# Patient Record
Sex: Female | Born: 1950 | Race: White | Hispanic: No | Marital: Married | Smoking: Never smoker
Health system: Southern US, Community
[De-identification: ages and names within clinical notes are randomized; demographics above are authoritative.]

## PROBLEM LIST (undated history)

## (undated) DIAGNOSIS — Z8739 Personal history of other diseases of the musculoskeletal system and connective tissue: Secondary | ICD-10-CM

## (undated) DIAGNOSIS — R002 Palpitations: Secondary | ICD-10-CM

## (undated) DIAGNOSIS — Z780 Asymptomatic menopausal state: Secondary | ICD-10-CM

## (undated) HISTORY — PX: APPENDECTOMY: SHX54

## (undated) HISTORY — DX: Asymptomatic menopausal state: Z78.0

## (undated) HISTORY — DX: Palpitations: R00.2

## (undated) HISTORY — DX: Personal history of other diseases of the musculoskeletal system and connective tissue: Z87.39

---

## 1979-08-05 HISTORY — PX: TUBAL LIGATION: SHX77

## 2011-11-19 ENCOUNTER — Emergency Department: Payer: Self-pay | Admitting: Emergency Medicine

## 2013-01-13 LAB — HM PAP SMEAR: HM PAP: NEGATIVE

## 2013-01-20 ENCOUNTER — Ambulatory Visit: Payer: Self-pay | Admitting: Obstetrics and Gynecology

## 2014-03-10 ENCOUNTER — Ambulatory Visit: Payer: Self-pay | Admitting: Obstetrics and Gynecology

## 2014-03-13 LAB — HM MAMMOGRAPHY

## 2014-11-24 DIAGNOSIS — N6019 Diffuse cystic mastopathy of unspecified breast: Secondary | ICD-10-CM | POA: Insufficient documentation

## 2014-11-24 DIAGNOSIS — N952 Postmenopausal atrophic vaginitis: Secondary | ICD-10-CM | POA: Insufficient documentation

## 2014-11-24 LAB — HM COLONOSCOPY

## 2015-01-23 ENCOUNTER — Encounter: Payer: Self-pay | Admitting: Obstetrics and Gynecology

## 2015-02-01 ENCOUNTER — Ambulatory Visit (INDEPENDENT_AMBULATORY_CARE_PROVIDER_SITE_OTHER): Payer: Federal, State, Local not specified - PPO | Admitting: Obstetrics and Gynecology

## 2015-02-01 ENCOUNTER — Encounter: Payer: Self-pay | Admitting: Obstetrics and Gynecology

## 2015-02-01 VITALS — BP 116/66 | HR 71 | Ht 68.0 in | Wt 155.9 lb

## 2015-02-01 DIAGNOSIS — Z01419 Encounter for gynecological examination (general) (routine) without abnormal findings: Secondary | ICD-10-CM | POA: Diagnosis not present

## 2015-02-01 DIAGNOSIS — Z1211 Encounter for screening for malignant neoplasm of colon: Secondary | ICD-10-CM

## 2015-02-01 DIAGNOSIS — Z780 Asymptomatic menopausal state: Secondary | ICD-10-CM | POA: Insufficient documentation

## 2015-02-01 DIAGNOSIS — Z1231 Encounter for screening mammogram for malignant neoplasm of breast: Secondary | ICD-10-CM

## 2015-02-01 NOTE — Progress Notes (Signed)
Patient ID: Paige Romero Zieske, female   DOB: 05/31/1951, 64 y.o.   MRN: [id number]  ANNUAL PREVENTATIVE CARE GYN  ENCOUNTER NOTE  Subjective:       Paige Romero Higinbotham is a 64 y.o., [phone], female with history of vaginal atrophy, fibrocystic breast and heart palpitations is presenting for a routine annual gynecologic exam.  Current complaints: 1.  none   BLF, PA-S Gynecologic History No LMP recorded. Patient is postmenopausal. Contraception: post menopausal status;  Last Pap: 2014 neg.neg . Results were: normal Last mammogram: 03/13/2014. Results were: normal  Obstetric History OB History  No data available    Past Medical History  Diagnosis Date  . Menopause   . History of osteopenia   . Heart palpitations     Past Surgical History  Procedure Laterality Date  . Tubal ligation  1981  . Appendectomy      No current outpatient prescriptions on file prior to visit.   No current facility-administered medications on file prior to visit.    Allergies  Allergen Reactions  . Penicillins Nausea Only    History   Social History  . Marital Status: Married    Spouse Name: N/A  . Number of Children: N/A  . Years of Education: N/A   Occupational History  . Not on file.   Social History Main Topics  . Smoking status: Never Smoker   . Smokeless tobacco: Not on file  . Alcohol Use: No  . Drug Use: No  . Sexual Activity: No   Other Topics Concern  . Not on file   Social History Narrative    Family History  Problem Relation Age of Onset  . Heart disease Mother   . Colon cancer Brother   . Diabetes Neg Hx   . Breast cancer Neg Hx   . Ovarian cancer Neg Hx     The following portions of the patient's history were reviewed and updated as appropriate: allergies, current medications, past family history, past medical history, past social history, past surgical history and problem list.  Review of Systems ROS Review of Systems - General ROS: negative for - chills,  fatigue, fever, hot flashes, night sweats, weight gain or weight loss Psychological ROS: negative for - anxiety, decreased libido, depression, mood swings, physical abuse or sexual abuse Ophthalmic ROS: negative for - blurry vision, eye pain or loss of vision ENT ROS: negative for - headaches, hearing change, visual changes or vocal changes Allergy and Immunology ROS: negative for - hives, itchy/watery eyes or seasonal allergies Hematological and Lymphatic ROS: negative for - bleeding problems, bruising, swollen lymph nodes or weight loss Endocrine ROS: negative for - galactorrhea, hair pattern changes, hot flashes, malaise/lethargy, mood swings, palpitations, polydipsia/polyuria, skin changes, temperature intolerance or unexpected weight changes Breast ROS: negative for - new or changing breast lumps or nipple discharge Respiratory ROS: negative for - cough or shortness of breath Cardiovascular ROS: negative for - chest pain, irregular heartbeat, palpitations or shortness of breath Gastrointestinal ROS: no abdominal pain, change in bowel habits, or black or bloody stools Genito-Urinary ROS: no dysuria, trouble voiding, or hematuria Musculoskeletal ROS: negative for - joint pain or joint stiffness Neurological ROS: negative for - bowel and bladder control changes Dermatological ROS: negative for rash and skin lesion changes   Objective:   BP 116/66 mmHg  Pulse 71  Ht 5\' 8"  (1.727 m)  Wt 155 lb 14.4 oz (70.716 kg)  BMI 23.71 kg/m2 CONSTITUTIONAL: Well-developed, well-nourished female in no acute distress.  PSYCHIATRIC: Normal mood and affect. Normal behavior. Normal judgment and thought content. NEUROLGIC: Alert and oriented to person, place, and time. Normal muscle tone coordination. No cranial nerve deficit noted. HENT:  Normocephalic, atraumatic, External right and left ear normal. Oropharynx is clear and moist EYES: Conjunctivae and EOM are normal. Pupils are equal, round, and reactive  to light. No scleral icterus.  NECK: Normal range of motion, supple, no masses.  Normal thyroid.  SKIN: Skin is warm and dry. No rash noted. Not diaphoretic. No erythema. No pallor. CARDIOVASCULAR: Normal heart rate noted, regular rhythm, no murmur. RESPIRATORY: Clear to auscultation bilaterally. Effort and breath sounds normal, no problems with respiration noted. BREASTS: Symmetric in size. No masses, skin changes, nipple drainage, or lymphadenopathy. ABDOMEN: Soft, normal bowel sounds, no distention noted.  No tenderness, rebound or guarding.  BLADDER: Normal PELVIC:  External Genitalia: Atrophic changes present. No rash, no epithelial breakdown, no          lesions  BUS: Normal  Vagina: Atrophic introitus; single-digit exam done , vaginal wall atrophy present  Cervix: Atrophic changes  Uterus: Normal  Adnexa: Normal  RV: External Exam NormaI, No Rectal Masses and Normal Sphincter tone  MUSCULOSKELETAL: Normal range of motion. No tenderness.  No cyanosis, clubbing, or edema.  2+ distal pulses. LYMPHATIC: No Axillary, Supraclavicular, or Inguinal Adenopathy.    Assessment:   Annual gynecologic examination 64 y.o. Contraception: post menopausal status; asymptomatic Normal BMI  Vaginal atrophy - asymptomatic  Problem List Items Addressed This Visit    None      Plan:  Pap: not due until 2017  Mammogram: Ordered Stool Guaiac Testing:  Ordered Labs: Lipid 1, FBS, TSH and Hemoglobin A1C Routine preventative health maintenance measures emphasized: Exercise/Diet/Weight control, Tobacco Warnings, Alcohol/Substance use risks and Stress Management Return to Clinic - 1 Year   Crystal Zurich, CMA   Herold Harms, MD    I have seen, interviewed, and examined the patient in conjunction with the [employer].A. student and affirm the diagnosis and management plan. Shyam Dawson A. Mikesha Migliaccio, MD, [facility]

## 2015-08-10 IMAGING — MG MAM DGTL SCRN MAM NO ORDER W/CAD
5 series · 5 of 5 positions shown · non-contrast
Comparison: Previous exam(s).

CLINICAL DATA: Screening.

EXAM:
DIGITAL SCREENING BILATERAL MAMMOGRAM WITH CAD

[L CC]
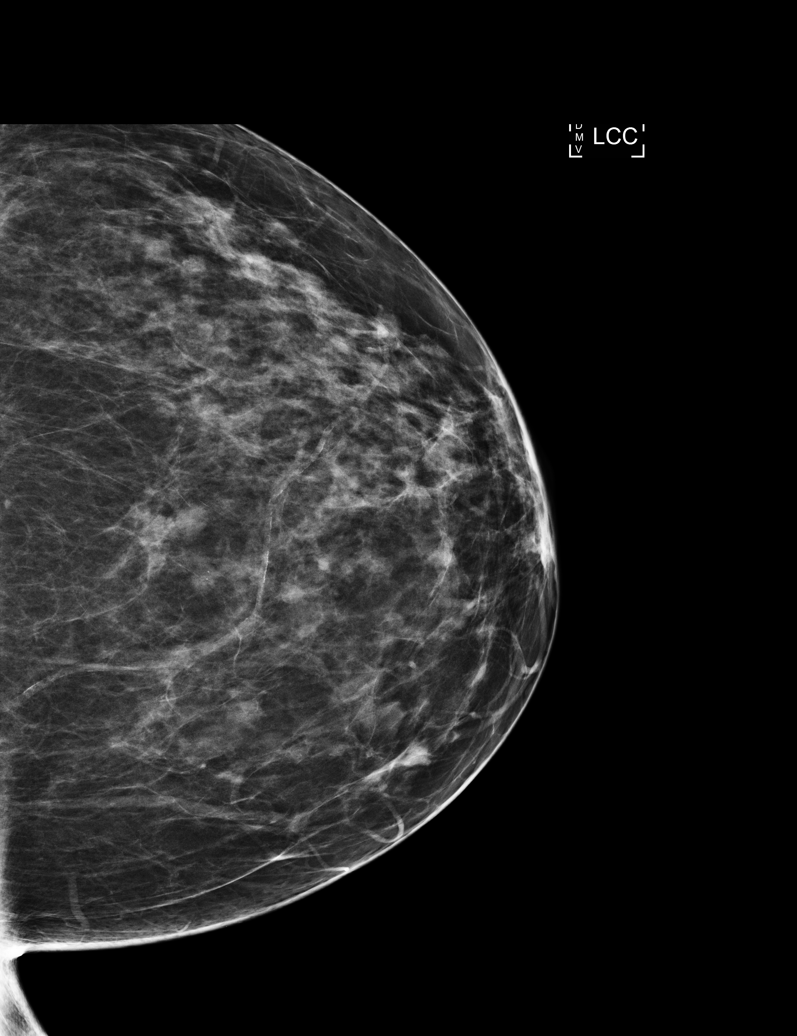

[L MLO (1 of 2)]
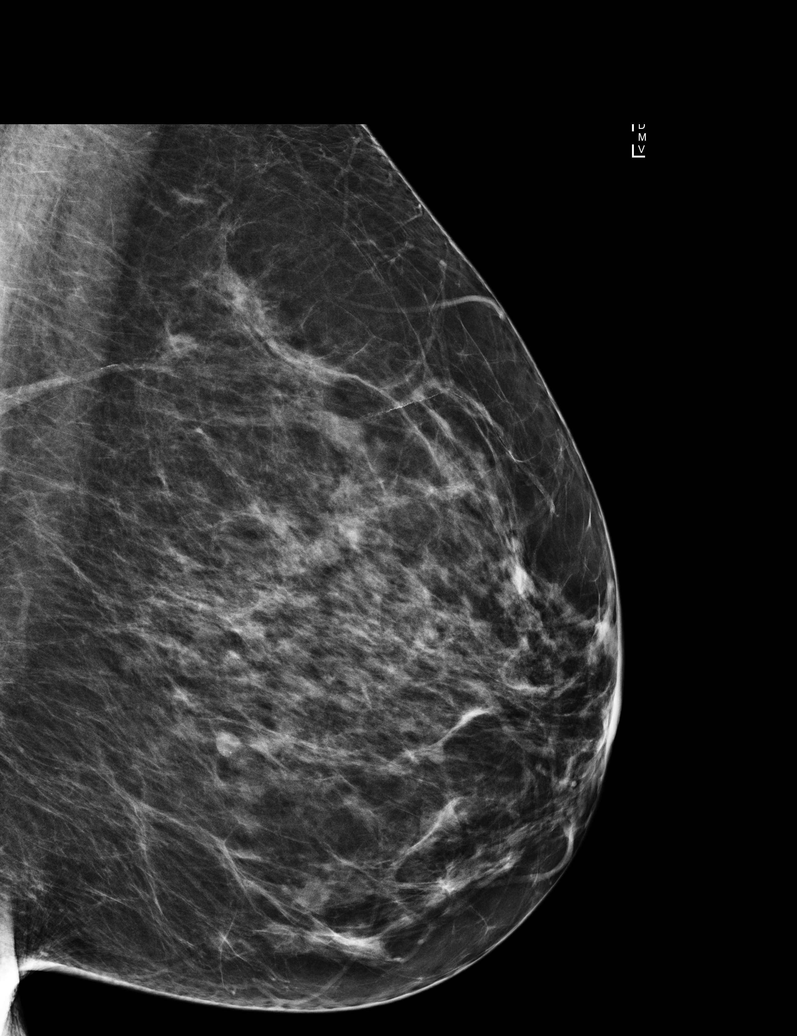

[R CC]
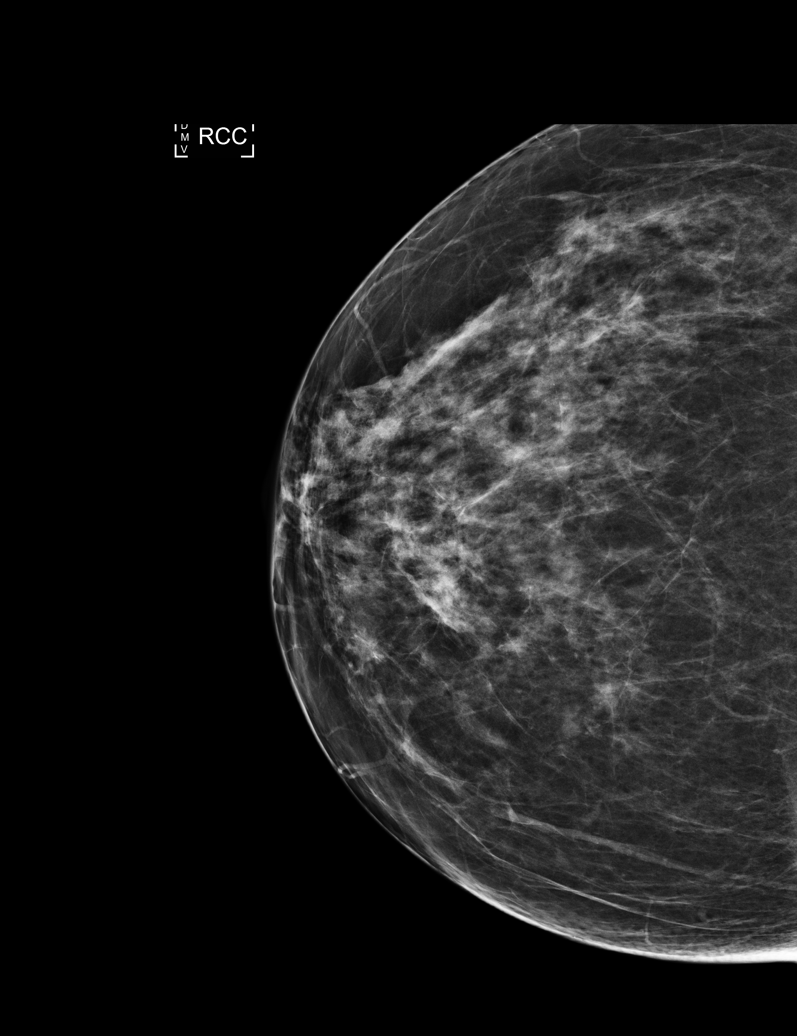

[R MLO]
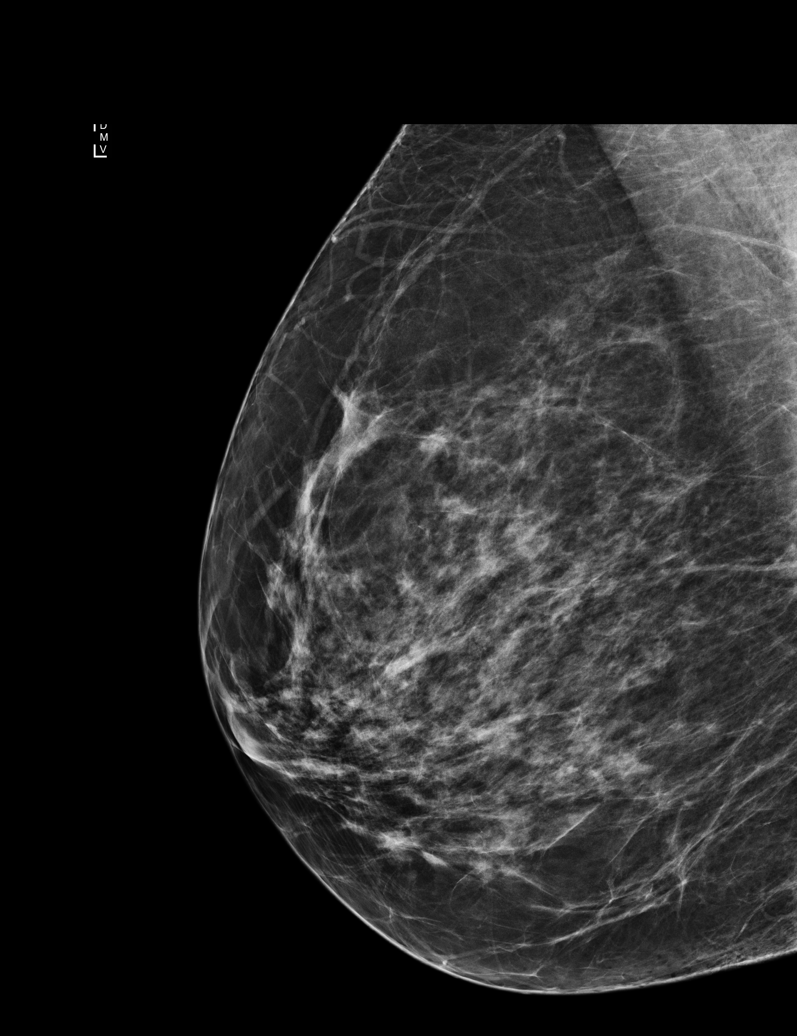

[L MLO (2 of 2)]
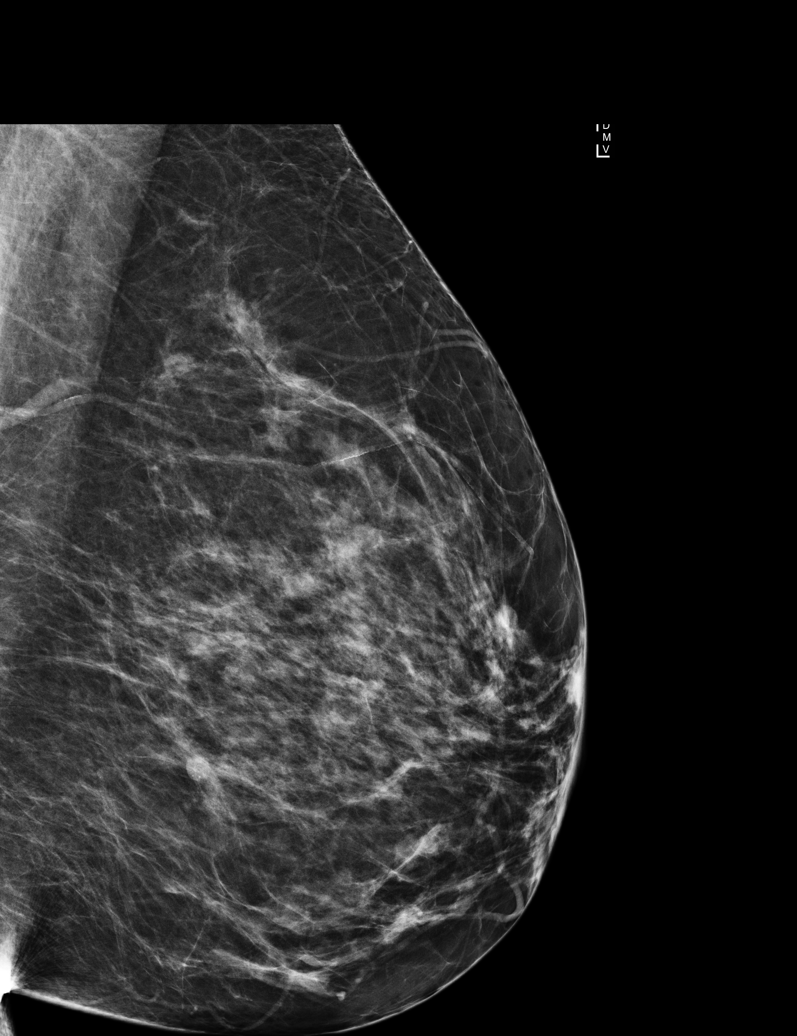

[5 of 5 positions shown; findings below may reference images not displayed]

ACR Breast Density Category c: The breast tissue is heterogeneously
dense, which may obscure small masses.
FINDINGS: There are no findings suspicious for malignancy. Images were
processed with CAD.
IMPRESSION: No mammographic evidence of malignancy. A result letter of this
screening mammogram will be mailed directly to the patient.

RECOMMENDATION:
Screening mammogram in one year. (Code:YJ-2-FEZ)

BI-RADS CATEGORY  1: Negative.

## 2016-02-12 ENCOUNTER — Encounter: Payer: Federal, State, Local not specified - PPO | Admitting: Obstetrics and Gynecology

## 2016-07-09 ENCOUNTER — Encounter: Payer: Federal, State, Local not specified - PPO | Admitting: Obstetrics and Gynecology

## 2016-07-11 ENCOUNTER — Other Ambulatory Visit: Payer: Self-pay | Admitting: Family Medicine

## 2016-07-11 DIAGNOSIS — Z780 Asymptomatic menopausal state: Secondary | ICD-10-CM

## 2016-07-11 DIAGNOSIS — Z1239 Encounter for other screening for malignant neoplasm of breast: Secondary | ICD-10-CM

## 2017-03-05 ENCOUNTER — Other Ambulatory Visit: Payer: Self-pay | Admitting: Family Medicine

## 2017-03-05 DIAGNOSIS — Z780 Asymptomatic menopausal state: Secondary | ICD-10-CM

## 2017-03-05 DIAGNOSIS — Z1239 Encounter for other screening for malignant neoplasm of breast: Secondary | ICD-10-CM

## 2017-03-16 ENCOUNTER — Ambulatory Visit
Admission: RE | Admit: 2017-03-16 | Discharge: 2017-03-16 | Disposition: A | Discharge status: Home / Self Care | Payer: Federal, State, Local not specified - PPO | Source: Ambulatory Visit | Attending: Family Medicine | Admitting: Family Medicine

## 2017-03-16 DIAGNOSIS — Z1231 Encounter for screening mammogram for malignant neoplasm of breast: Secondary | ICD-10-CM | POA: Insufficient documentation

## 2017-03-16 DIAGNOSIS — Z1239 Encounter for other screening for malignant neoplasm of breast: Secondary | ICD-10-CM

## 2018-08-16 IMAGING — MG MM DIGITAL SCREENING BILAT W/ CAD
4 series · 4 of 4 positions shown · non-contrast
Comparison: Previous exam(s).

CLINICAL DATA: Screening.

EXAM:
DIGITAL SCREENING BILATERAL MAMMOGRAM WITH CAD

[L MLO]
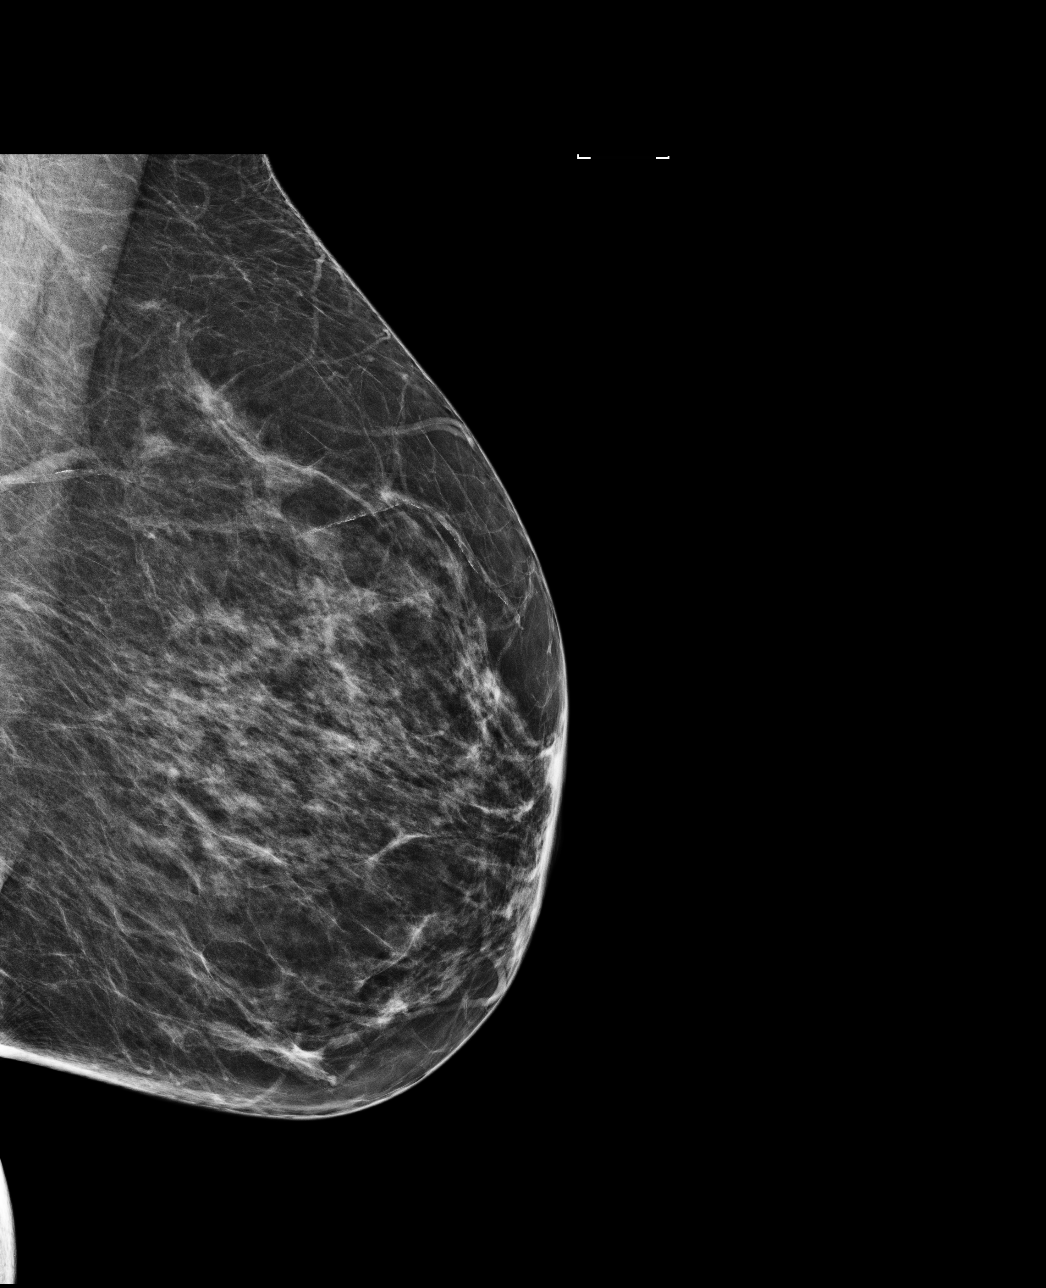

[L CC]
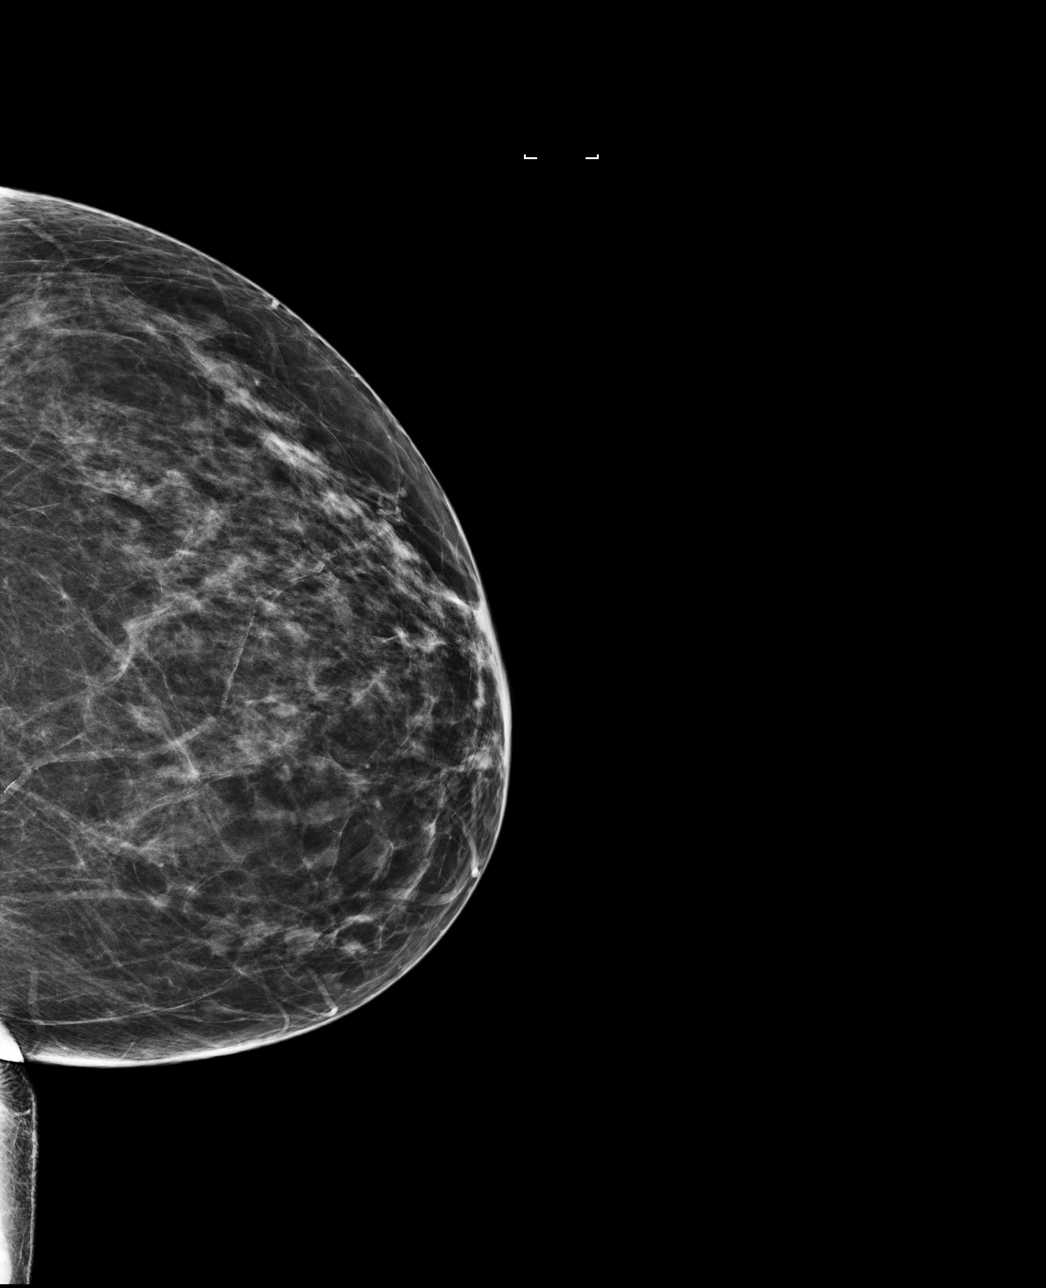

[R CC]
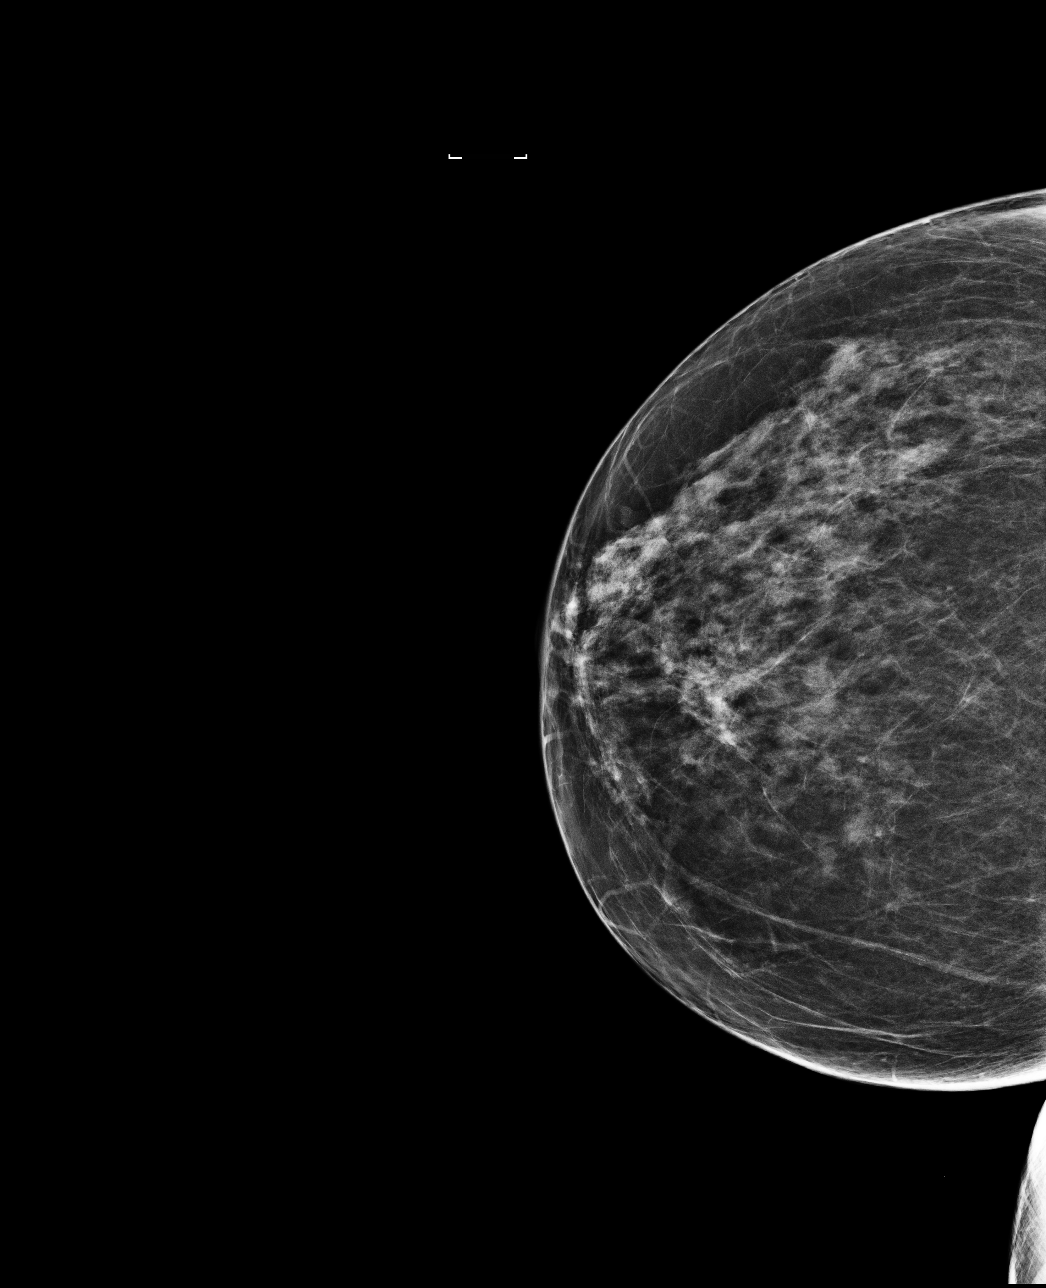

[R MLO]
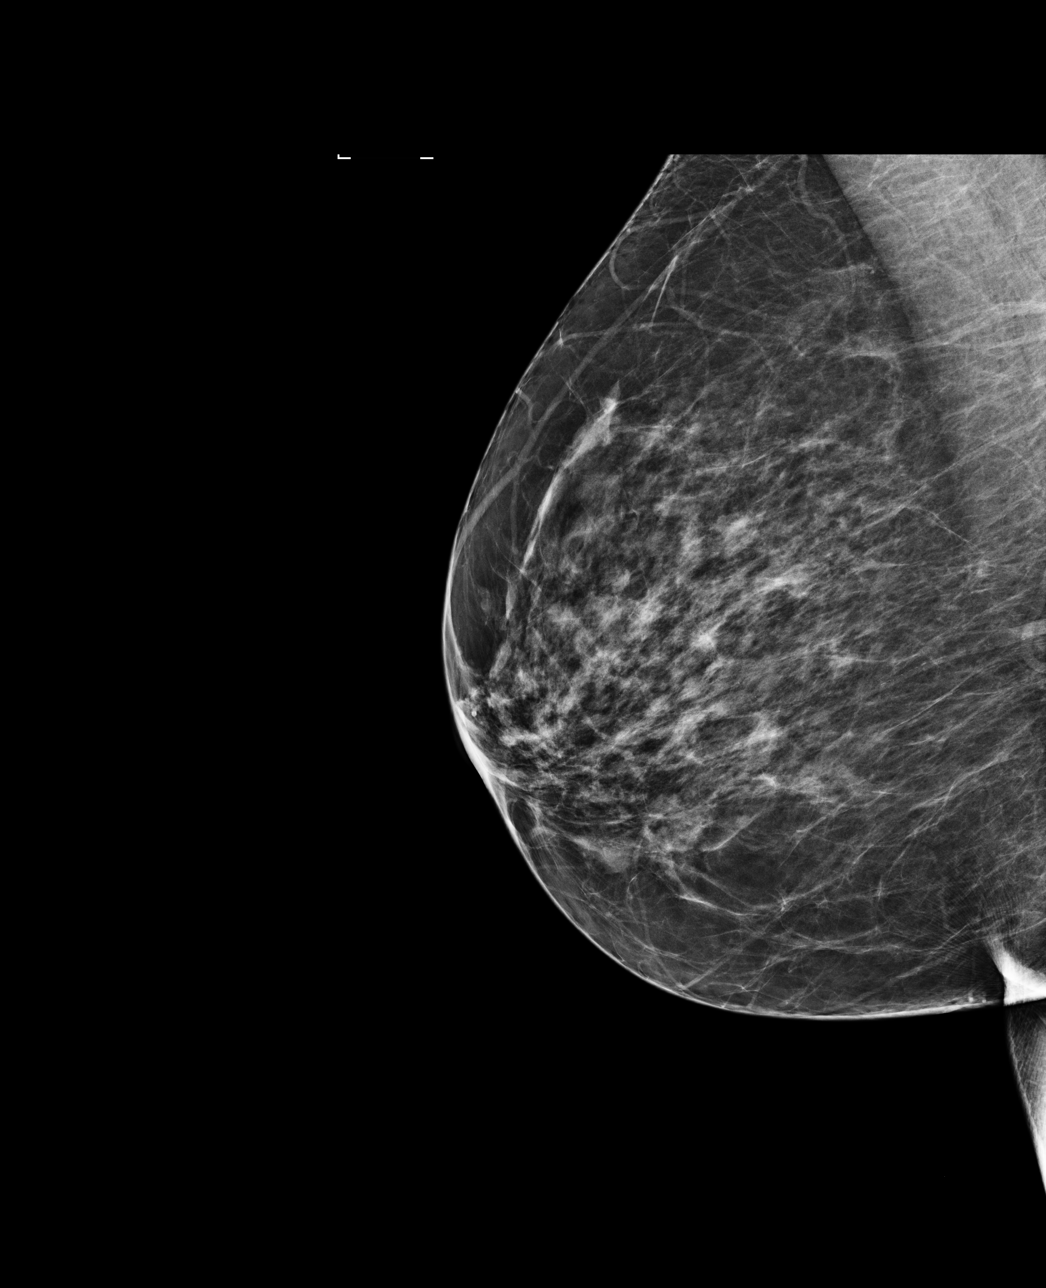

[4 of 4 positions shown; findings below may reference images not displayed]

ACR Breast Density Category c: The breast tissue is heterogeneously
dense, which may obscure small masses.
FINDINGS: There are no findings suspicious for malignancy. Images were
processed with CAD.
IMPRESSION: No mammographic evidence of malignancy. A result letter of this
screening mammogram will be mailed directly to the patient.

RECOMMENDATION:
Screening mammogram in one year. (Code:YJ-2-FEZ)

BI-RADS CATEGORY  1: Negative.

## 2019-08-30 ENCOUNTER — Other Ambulatory Visit: Payer: Self-pay

## 2019-08-30 DIAGNOSIS — E785 Hyperlipidemia, unspecified: Secondary | ICD-10-CM

## 2019-08-30 NOTE — Progress Notes (Signed)
Dr Gwen Pounds pt for CT CA Score from [facility]   DX E78.5 Mild hyperlipidemia

## 2019-09-21 ENCOUNTER — Inpatient Hospital Stay: Admission: RE | Admit: 2019-09-21 | Payer: Federal, State, Local not specified - PPO | Source: Ambulatory Visit

## 2019-10-04 ENCOUNTER — Inpatient Hospital Stay: Admission: RE | Admit: 2019-10-04 | Payer: Federal, State, Local not specified - PPO | Source: Ambulatory Visit

## 2019-11-25 ENCOUNTER — Other Ambulatory Visit: Payer: Self-pay

## 2019-11-25 ENCOUNTER — Ambulatory Visit (INDEPENDENT_AMBULATORY_CARE_PROVIDER_SITE_OTHER)
Admission: RE | Admit: 2019-11-25 | Discharge: 2019-11-25 | Disposition: A | Discharge status: Home / Self Care | Payer: Self-pay | Source: Ambulatory Visit | Attending: Cardiovascular Disease | Admitting: Cardiovascular Disease

## 2019-11-25 DIAGNOSIS — E785 Hyperlipidemia, unspecified: Secondary | ICD-10-CM

## 2020-08-12 ENCOUNTER — Other Ambulatory Visit: Payer: Federal, State, Local not specified - PPO

## 2020-08-12 DIAGNOSIS — Z20822 Contact with and (suspected) exposure to covid-19: Secondary | ICD-10-CM

## 2020-08-16 LAB — NOVEL CORONAVIRUS, NAA: SARS-CoV-2, NAA: NOT DETECTED
